# Patient Record
Sex: Female | Born: 1995 | Race: White | Hispanic: No | Marital: Single | Smoking: Current some day smoker
Health system: Southern US, Community
[De-identification: ages and names within clinical notes are randomized; demographics above are authoritative.]

## PROBLEM LIST (undated history)

## (undated) DIAGNOSIS — F329 Major depressive disorder, single episode, unspecified: Secondary | ICD-10-CM

## (undated) DIAGNOSIS — F32A Depression, unspecified: Secondary | ICD-10-CM

## (undated) DIAGNOSIS — F1110 Opioid abuse, uncomplicated: Secondary | ICD-10-CM

## (undated) DIAGNOSIS — F1910 Other psychoactive substance abuse, uncomplicated: Secondary | ICD-10-CM

## (undated) DIAGNOSIS — F419 Anxiety disorder, unspecified: Secondary | ICD-10-CM

---

## 2017-12-29 ENCOUNTER — Emergency Department (HOSPITAL_COMMUNITY): Payer: No Typology Code available for payment source

## 2017-12-29 ENCOUNTER — Emergency Department (HOSPITAL_COMMUNITY): Payer: No Typology Code available for payment source | Admitting: Anesthesiology

## 2017-12-29 ENCOUNTER — Encounter (HOSPITAL_COMMUNITY): Admission: EM | Disposition: A | Discharge status: Home / Self Care | Payer: Self-pay | Source: Home / Self Care

## 2017-12-29 ENCOUNTER — Encounter (HOSPITAL_COMMUNITY): Payer: Self-pay | Admitting: *Deleted

## 2017-12-29 ENCOUNTER — Inpatient Hospital Stay (HOSPITAL_COMMUNITY)
Admission: EM | Admit: 2017-12-29 | Discharge: 2018-01-02 | DRG: 957 | Disposition: A | Discharge status: Home / Self Care | Payer: No Typology Code available for payment source | Attending: General Surgery | Admitting: General Surgery

## 2017-12-29 ENCOUNTER — Inpatient Hospital Stay (HOSPITAL_COMMUNITY): Payer: No Typology Code available for payment source

## 2017-12-29 DIAGNOSIS — F17200 Nicotine dependence, unspecified, uncomplicated: Secondary | ICD-10-CM | POA: Diagnosis present

## 2017-12-29 DIAGNOSIS — S36039A Unspecified laceration of spleen, initial encounter: Secondary | ICD-10-CM | POA: Diagnosis present

## 2017-12-29 DIAGNOSIS — S270XXA Traumatic pneumothorax, initial encounter: Secondary | ICD-10-CM | POA: Diagnosis present

## 2017-12-29 DIAGNOSIS — S36032A Major laceration of spleen, initial encounter: Secondary | ICD-10-CM | POA: Diagnosis present

## 2017-12-29 DIAGNOSIS — F1110 Opioid abuse, uncomplicated: Secondary | ICD-10-CM | POA: Diagnosis present

## 2017-12-29 DIAGNOSIS — S22069A Unspecified fracture of T7-T8 vertebra, initial encounter for closed fracture: Secondary | ICD-10-CM | POA: Diagnosis present

## 2017-12-29 DIAGNOSIS — R740 Nonspecific elevation of levels of transaminase and lactic acid dehydrogenase [LDH]: Secondary | ICD-10-CM

## 2017-12-29 DIAGNOSIS — D62 Acute posthemorrhagic anemia: Secondary | ICD-10-CM

## 2017-12-29 DIAGNOSIS — S92352A Displaced fracture of fifth metatarsal bone, left foot, initial encounter for closed fracture: Secondary | ICD-10-CM | POA: Diagnosis present

## 2017-12-29 DIAGNOSIS — Z3201 Encounter for pregnancy test, result positive: Secondary | ICD-10-CM | POA: Diagnosis present

## 2017-12-29 DIAGNOSIS — R7401 Elevation of levels of liver transaminase levels: Secondary | ICD-10-CM

## 2017-12-29 DIAGNOSIS — J029 Acute pharyngitis, unspecified: Secondary | ICD-10-CM | POA: Diagnosis not present

## 2017-12-29 DIAGNOSIS — S73015A Posterior dislocation of left hip, initial encounter: Principal | ICD-10-CM

## 2017-12-29 DIAGNOSIS — F418 Other specified anxiety disorders: Secondary | ICD-10-CM | POA: Diagnosis present

## 2017-12-29 DIAGNOSIS — S41112A Laceration without foreign body of left upper arm, initial encounter: Secondary | ICD-10-CM | POA: Diagnosis present

## 2017-12-29 DIAGNOSIS — G8918 Other acute postprocedural pain: Secondary | ICD-10-CM

## 2017-12-29 DIAGNOSIS — S27322A Contusion of lung, bilateral, initial encounter: Secondary | ICD-10-CM | POA: Diagnosis present

## 2017-12-29 DIAGNOSIS — R52 Pain, unspecified: Secondary | ICD-10-CM | POA: Diagnosis present

## 2017-12-29 DIAGNOSIS — S0081XA Abrasion of other part of head, initial encounter: Secondary | ICD-10-CM | POA: Diagnosis present

## 2017-12-29 DIAGNOSIS — S42101A Fracture of unspecified part of scapula, right shoulder, initial encounter for closed fracture: Secondary | ICD-10-CM

## 2017-12-29 DIAGNOSIS — Z590 Homelessness: Secondary | ICD-10-CM | POA: Diagnosis not present

## 2017-12-29 DIAGNOSIS — S73005A Unspecified dislocation of left hip, initial encounter: Secondary | ICD-10-CM

## 2017-12-29 DIAGNOSIS — Z3490 Encounter for supervision of normal pregnancy, unspecified, unspecified trimester: Secondary | ICD-10-CM

## 2017-12-29 DIAGNOSIS — K661 Hemoperitoneum: Secondary | ICD-10-CM | POA: Diagnosis present

## 2017-12-29 DIAGNOSIS — J939 Pneumothorax, unspecified: Secondary | ICD-10-CM

## 2017-12-29 DIAGNOSIS — F1910 Other psychoactive substance abuse, uncomplicated: Secondary | ICD-10-CM

## 2017-12-29 DIAGNOSIS — S22000A Wedge compression fracture of unspecified thoracic vertebra, initial encounter for closed fracture: Secondary | ICD-10-CM

## 2017-12-29 DIAGNOSIS — S27329A Contusion of lung, unspecified, initial encounter: Secondary | ICD-10-CM | POA: Diagnosis present

## 2017-12-29 DIAGNOSIS — S22079A Unspecified fracture of T9-T10 vertebra, initial encounter for closed fracture: Secondary | ICD-10-CM | POA: Diagnosis present

## 2017-12-29 DIAGNOSIS — S5002XA Contusion of left elbow, initial encounter: Secondary | ICD-10-CM

## 2017-12-29 HISTORY — DX: Depression, unspecified: F32.A

## 2017-12-29 HISTORY — PX: HIP CLOSED REDUCTION: SHX983

## 2017-12-29 HISTORY — DX: Other psychoactive substance abuse, uncomplicated: F19.10

## 2017-12-29 HISTORY — DX: Major depressive disorder, single episode, unspecified: F32.9

## 2017-12-29 HISTORY — DX: Anxiety disorder, unspecified: F41.9

## 2017-12-29 HISTORY — DX: Opioid abuse, uncomplicated: F11.10

## 2017-12-29 LAB — COMPREHENSIVE METABOLIC PANEL
ALBUMIN: 3.1 g/dL — AB (ref 3.5–5.0)
ALT: 117 U/L — ABNORMAL HIGH (ref 14–54)
ANION GAP: 10 (ref 5–15)
AST: 313 U/L — ABNORMAL HIGH (ref 15–41)
Alkaline Phosphatase: 99 U/L (ref 38–126)
BUN: 5 mg/dL — ABNORMAL LOW (ref 6–20)
CHLORIDE: 103 mmol/L (ref 101–111)
CO2: 24 mmol/L (ref 22–32)
Calcium: 9.1 mg/dL (ref 8.9–10.3)
Creatinine, Ser: 0.7 mg/dL (ref 0.44–1.00)
GFR calc non Af Amer: >60 mL/min (ref >60)
GLUCOSE: 217 mg/dL — AB (ref 65–99)
POTASSIUM: 3 mmol/L — AB (ref 3.5–5.1)
SODIUM: 137 mmol/L (ref 135–145)
Total Bilirubin: 0.6 mg/dL (ref 0.3–1.2)
Total Protein: 7.4 g/dL (ref 6.5–8.1)

## 2017-12-29 LAB — URINALYSIS, ROUTINE W REFLEX MICROSCOPIC
Bacteria, UA: NONE SEEN
Bilirubin Urine: NEGATIVE
Glucose, UA: NEGATIVE mg/dL
KETONES UR: NEGATIVE mg/dL
LEUKOCYTES UA: NEGATIVE
Nitrite: NEGATIVE
Protein, ur: NEGATIVE mg/dL
SPECIFIC GRAVITY, URINE: 1.029 (ref 1.005–1.030)
SQUAMOUS EPITHELIAL / LPF: NONE SEEN
pH: 5 (ref 5.0–8.0)

## 2017-12-29 LAB — CBC
HEMATOCRIT: 38.3 % (ref 36.0–46.0)
HEMOGLOBIN: 12.5 g/dL (ref 12.0–15.0)
MCH: 26.8 pg (ref 26.0–34.0)
MCHC: 32.6 g/dL (ref 30.0–36.0)
MCV: 82 fL (ref 78.0–100.0)
Platelets: 300 10*3/uL (ref 150–400)
RBC: 4.67 MIL/uL (ref 3.87–5.11)
RDW: 13.6 % (ref 11.5–15.5)
WBC: 19.4 10*3/uL — ABNORMAL HIGH (ref 4.0–10.5)

## 2017-12-29 LAB — I-STAT CG4 LACTIC ACID, ED: LACTIC ACID, VENOUS: 2.38 mmol/L — AB (ref 0.5–1.9)

## 2017-12-29 LAB — RAPID URINE DRUG SCREEN, HOSP PERFORMED
AMPHETAMINES: POSITIVE — AB
BARBITURATES: NOT DETECTED
BENZODIAZEPINES: NOT DETECTED
COCAINE: NOT DETECTED
Opiates: POSITIVE — AB
Tetrahydrocannabinol: NOT DETECTED

## 2017-12-29 LAB — I-STAT CHEM 8, ED
BUN: 7 mg/dL (ref 6–20)
CALCIUM ION: 1.19 mmol/L (ref 1.15–1.40)
Chloride: 101 mmol/L (ref 101–111)
Creatinine, Ser: 0.5 mg/dL (ref 0.44–1.00)
Glucose, Bld: 210 mg/dL — ABNORMAL HIGH (ref 65–99)
HCT: 40 % (ref 36.0–46.0)
HEMOGLOBIN: 13.6 g/dL (ref 12.0–15.0)
Potassium: 3.1 mmol/L — ABNORMAL LOW (ref 3.5–5.1)
SODIUM: 140 mmol/L (ref 135–145)
TCO2: 28 mmol/L (ref 22–32)

## 2017-12-29 LAB — HCG, QUANTITATIVE, PREGNANCY: hCG, Beta Chain, Quant, S: <1 m[IU]/mL (ref <5)

## 2017-12-29 LAB — GLUCOSE, CAPILLARY: GLUCOSE-CAPILLARY: 115 mg/dL — AB (ref 65–99)

## 2017-12-29 LAB — I-STAT BETA HCG BLOOD, ED (MC, WL, AP ONLY): HCG, QUANTITATIVE: 12 m[IU]/mL — AB (ref <5)

## 2017-12-29 LAB — TRIGLYCERIDES: TRIGLYCERIDES: 40 mg/dL (ref <150)

## 2017-12-29 LAB — HEMOGLOBIN
HEMOGLOBIN: 9.1 g/dL — AB (ref 12.0–15.0)
Hemoglobin: 9.4 g/dL — ABNORMAL LOW (ref 12.0–15.0)

## 2017-12-29 LAB — PROTIME-INR
INR: 1.14
Prothrombin Time: 14.5 seconds (ref 11.4–15.2)

## 2017-12-29 LAB — MRSA PCR SCREENING: MRSA BY PCR: NEGATIVE

## 2017-12-29 LAB — SAMPLE TO BLOOD BANK

## 2017-12-29 LAB — LACTIC ACID, PLASMA: Lactic Acid, Venous: 1 mmol/L (ref 0.5–1.9)

## 2017-12-29 LAB — ETHANOL: Alcohol, Ethyl (B): <10 mg/dL (ref <10)

## 2017-12-29 SURGERY — CLOSED REDUCTION, HIP
Anesthesia: General | Site: Hip | Laterality: Left

## 2017-12-29 MED ORDER — METHOCARBAMOL 1000 MG/10ML IJ SOLN
500.0000 mg | Freq: Once | INTRAVENOUS | Status: AC
  Administered 2017-12-29: 500 mg via INTRAVENOUS
  Filled 2017-12-29: qty 5

## 2017-12-29 MED ORDER — LACTATED RINGERS IV SOLN
INTRAVENOUS | Status: DC | PRN
  Administered 2017-12-29: 06:00:00 via INTRAVENOUS

## 2017-12-29 MED ORDER — PROPOFOL 10 MG/ML IV BOLUS
INTRAVENOUS | Status: DC | PRN
  Administered 2017-12-29: 150 mg via INTRAVENOUS

## 2017-12-29 MED ORDER — PROPOFOL 500 MG/50ML IV EMUL
INTRAVENOUS | Status: DC | PRN
  Administered 2017-12-29: 30 ug/kg/min via INTRAVENOUS

## 2017-12-29 MED ORDER — SODIUM CHLORIDE 0.9 % IV BOLUS (SEPSIS): 1000.0000 mL | Freq: Once | INTRAVENOUS | Status: DC

## 2017-12-29 MED ORDER — CEFAZOLIN SODIUM-DEXTROSE 2-3 GM-%(50ML) IV SOLR
INTRAVENOUS | Status: DC | PRN
  Administered 2017-12-29: 2 g via INTRAVENOUS

## 2017-12-29 MED ORDER — ACETAMINOPHEN 325 MG PO TABS
650.0000 mg | ORAL_TABLET | ORAL | Status: DC | PRN
  Administered 2017-12-30 – 2018-01-02 (×8): 650 mg via ORAL
  Filled 2017-12-29 (×8): qty 2

## 2017-12-29 MED ORDER — CHLORHEXIDINE GLUCONATE 0.12% ORAL RINSE (MEDLINE KIT)
15.0000 mL | Freq: Two times a day (BID) | OROMUCOSAL | Status: DC
  Administered 2017-12-29 – 2017-12-30 (×4): 15 mL via OROMUCOSAL

## 2017-12-29 MED ORDER — FENTANYL 2500MCG IN NS 250ML (10MCG/ML) PREMIX INFUSION
0.0000 ug/h | INTRAVENOUS | Status: DC
  Administered 2017-12-29 – 2017-12-30 (×2): 200 ug/h via INTRAVENOUS
  Filled 2017-12-29 (×2): qty 250

## 2017-12-29 MED ORDER — PANTOPRAZOLE SODIUM 40 MG PO TBEC
40.0000 mg | DELAYED_RELEASE_TABLET | Freq: Every day | ORAL | Status: DC
  Administered 2017-12-31 – 2018-01-02 (×3): 40 mg via ORAL
  Filled 2017-12-29 (×3): qty 1

## 2017-12-29 MED ORDER — SUCCINYLCHOLINE CHLORIDE 200 MG/10ML IV SOSY
PREFILLED_SYRINGE | INTRAVENOUS | Status: AC
  Filled 2017-12-29: qty 10

## 2017-12-29 MED ORDER — FENTANYL BOLUS VIA INFUSION
25.0000 ug | INTRAVENOUS | Status: DC | PRN
  Filled 2017-12-29: qty 25

## 2017-12-29 MED ORDER — PANTOPRAZOLE SODIUM 40 MG IV SOLR
40.0000 mg | Freq: Every day | INTRAVENOUS | Status: DC
  Administered 2017-12-29 – 2017-12-30 (×2): 40 mg via INTRAVENOUS
  Filled 2017-12-29 (×5): qty 40

## 2017-12-29 MED ORDER — PROPOFOL 10 MG/ML IV BOLUS
INTRAVENOUS | Status: AC
  Filled 2017-12-29: qty 20

## 2017-12-29 MED ORDER — SUCCINYLCHOLINE 20MG/ML (10ML) SYRINGE FOR MEDFUSION PUMP - OPTIME
INTRAMUSCULAR | Status: DC | PRN
  Administered 2017-12-29: 120 mg via INTRAVENOUS

## 2017-12-29 MED ORDER — MIDAZOLAM HCL 2 MG/2ML IJ SOLN
INTRAMUSCULAR | Status: AC
  Filled 2017-12-29: qty 2

## 2017-12-29 MED ORDER — FENTANYL CITRATE (PF) 100 MCG/2ML IJ SOLN
50.0000 ug | Freq: Once | INTRAMUSCULAR | Status: DC
  Filled 2017-12-29: qty 2

## 2017-12-29 MED ORDER — FENTANYL CITRATE (PF) 100 MCG/2ML IJ SOLN
50.0000 ug | INTRAMUSCULAR | Status: DC | PRN
  Administered 2017-12-29 (×3): 50 ug via INTRAVENOUS
  Filled 2017-12-29 (×3): qty 2

## 2017-12-29 MED ORDER — PROPOFOL 1000 MG/100ML IV EMUL
0.0000 ug/kg/min | INTRAVENOUS | Status: DC
  Administered 2017-12-29 – 2017-12-30 (×3): 40 ug/kg/min via INTRAVENOUS
  Administered 2017-12-30: 30 ug/kg/min via INTRAVENOUS
  Filled 2017-12-29 (×4): qty 100

## 2017-12-29 MED ORDER — FENTANYL CITRATE (PF) 250 MCG/5ML IJ SOLN
INTRAMUSCULAR | Status: DC | PRN
  Administered 2017-12-29: 100 ug via INTRAVENOUS

## 2017-12-29 MED ORDER — DOCUSATE SODIUM 100 MG PO CAPS
100.0000 mg | ORAL_CAPSULE | Freq: Two times a day (BID) | ORAL | Status: DC
  Administered 2017-12-30 – 2018-01-02 (×6): 100 mg via ORAL
  Filled 2017-12-29 (×10): qty 1

## 2017-12-29 MED ORDER — SODIUM CHLORIDE 0.9 % IV SOLN: INTRAVENOUS | Status: DC

## 2017-12-29 MED ORDER — METOPROLOL TARTRATE 5 MG/5ML IV SOLN: 5.0000 mg | Freq: Four times a day (QID) | INTRAVENOUS | Status: DC | PRN

## 2017-12-29 MED ORDER — HYDRALAZINE HCL 20 MG/ML IJ SOLN: 10.0000 mg | INTRAMUSCULAR | Status: DC | PRN

## 2017-12-29 MED ORDER — SODIUM CHLORIDE 0.9 % IV SOLN
25.0000 ug/h | INTRAVENOUS | Status: DC
  Administered 2017-12-29: 50 ug/h via INTRAVENOUS
  Filled 2017-12-29: qty 50

## 2017-12-29 MED ORDER — FENTANYL CITRATE (PF) 100 MCG/2ML IJ SOLN
50.0000 ug | Freq: Once | INTRAMUSCULAR | Status: AC
  Administered 2017-12-29: 50 ug via INTRAVENOUS

## 2017-12-29 MED ORDER — SODIUM CHLORIDE 0.9 % IV SOLN
INTRAVENOUS | Status: AC | PRN
  Administered 2017-12-29: 1000 mL via INTRAVENOUS

## 2017-12-29 MED ORDER — ONDANSETRON 4 MG PO TBDP: 4.0000 mg | ORAL_TABLET | Freq: Four times a day (QID) | ORAL | Status: DC | PRN

## 2017-12-29 MED ORDER — ORAL CARE MOUTH RINSE
15.0000 mL | Freq: Four times a day (QID) | OROMUCOSAL | Status: DC
  Administered 2017-12-29 – 2017-12-31 (×6): 15 mL via OROMUCOSAL

## 2017-12-29 MED ORDER — SODIUM CHLORIDE 0.9 % IV BOLUS (SEPSIS)
1000.0000 mL | Freq: Once | INTRAVENOUS | Status: AC
  Administered 2017-12-29: 1000 mL via INTRAVENOUS

## 2017-12-29 MED ORDER — IOPAMIDOL (ISOVUE-300) INJECTION 61%
INTRAVENOUS | Status: AC
  Administered 2017-12-29: 100 mL
  Filled 2017-12-29: qty 100

## 2017-12-29 MED ORDER — FENTANYL CITRATE (PF) 100 MCG/2ML IJ SOLN
INTRAMUSCULAR | Status: AC
  Filled 2017-12-29: qty 2

## 2017-12-29 MED ORDER — ONDANSETRON HCL 4 MG/2ML IJ SOLN
4.0000 mg | Freq: Once | INTRAMUSCULAR | Status: AC
  Administered 2017-12-29: 4 mg via INTRAVENOUS
  Filled 2017-12-29: qty 2

## 2017-12-29 MED ORDER — TETANUS-DIPHTH-ACELL PERTUSSIS 5-2.5-18.5 LF-MCG/0.5 IM SUSP
0.5000 mL | Freq: Once | INTRAMUSCULAR | Status: AC
  Administered 2017-12-29: 0.5 mL via INTRAMUSCULAR
  Filled 2017-12-29: qty 0.5

## 2017-12-29 MED ORDER — LIDOCAINE HCL (CARDIAC) 20 MG/ML IV SOLN
INTRAVENOUS | Status: DC | PRN
  Administered 2017-12-29: 60 mg via INTRATRACHEAL

## 2017-12-29 MED ORDER — METHOCARBAMOL 1000 MG/10ML IJ SOLN
500.0000 mg | Freq: Once | INTRAMUSCULAR | Status: DC
  Filled 2017-12-29: qty 5

## 2017-12-29 MED ORDER — ONDANSETRON HCL 4 MG/2ML IJ SOLN: 4.0000 mg | Freq: Four times a day (QID) | INTRAMUSCULAR | Status: DC | PRN

## 2017-12-29 MED ORDER — LIDOCAINE 2% (20 MG/ML) 5 ML SYRINGE
INTRAMUSCULAR | Status: AC
  Filled 2017-12-29: qty 5

## 2017-12-29 MED ORDER — LIDOCAINE-EPINEPHRINE (PF) 2 %-1:200000 IJ SOLN
10.0000 mL | Freq: Once | INTRAMUSCULAR | Status: DC
  Filled 2017-12-29: qty 20

## 2017-12-29 MED ORDER — KCL IN DEXTROSE-NACL 20-5-0.45 MEQ/L-%-% IV SOLN
INTRAVENOUS | Status: DC
  Administered 2017-12-29: 75 mL/h via INTRAVENOUS
  Administered 2017-12-29 – 2018-01-01 (×5): via INTRAVENOUS
  Filled 2017-12-29 (×6): qty 1000

## 2017-12-29 MED ORDER — FENTANYL CITRATE (PF) 250 MCG/5ML IJ SOLN
INTRAMUSCULAR | Status: AC
  Filled 2017-12-29: qty 5

## 2017-12-29 SURGICAL SUPPLY — 4 items
BNDG GAUZE ELAST 4 BULKY (GAUZE/BANDAGES/DRESSINGS) ×2 IMPLANT
DRSG MEPITEL 3X4 ME34 (GAUZE/BANDAGES/DRESSINGS) ×2 IMPLANT
GAUZE SPONGE 4X4 12PLY STRL (GAUZE/BANDAGES/DRESSINGS) ×2 IMPLANT
TRAY FOLEY CATH SILVER 14FR (SET/KITS/TRAYS/PACK) ×2 IMPLANT

## 2017-12-29 NOTE — Progress Notes (Signed)
Orthopedic Tech Progress Note Patient Details:  [place] CellarDanielle Kretz 01/20/1996 [id number]  Ortho Devices Type of Ortho Device: Ace wrap, Shoulder immobilizer, Knee Immobilizer, Short arm splint Ortho Device/Splint Location: hip abd pillow Ortho Device/Splint Interventions: Application   Post Interventions Patient Tolerated: Well Instructions Provided: Care of device   Saul FordyceJennifer C Ariza Evans 12/29/2017, 11:12 AM

## 2017-12-29 NOTE — Brief Op Note (Signed)
12/29/2017  7:26 AM  PATIENT:  [name]  22 y.o. female  PRE-OPERATIVE DIAGNOSIS:   1. Left hip posterior dislocation 2. Left arm traumatic laceration 5 cm x 5 cm 3. Left elbow swelling and recurvatum  POST-OPERATIVE DIAGNOSIS:   1. Left hip posterior dislocation 2. Left arm traumatic laceration 5 cm x 5 cm 3. Left elbow swelling and recurvatum  PROCEDURE:  Procedure(s): 1. CLOSED REDUCTION HIP UNDER GENERAL ANESTHESIA, LEFT 2. SURGICAL DEBRIDEMENT OF SKIN, SUBCU, AND FASCIA LEFT ARM WOUND 3. COMPLEX CLOSURE 5CM 4. STRESS FLOURO LEFT HIP 5. STRESS FLOURO LEFT ELBOW  SURGEON:  Surgeon(s) and Role:    * Myrene GalasHandy, Dahmir Epperly, MD - Primary  ASSISTANTS: none   ANESTHESIA:   general  EBL:  minimal  BLOOD ADMINISTERED:none  DRAINS: none   LOCAL MEDICATIONS USED:  NONE  SPECIMEN:  No Specimen  DISPOSITION OF SPECIMEN:  N/A  COUNTS:  YES  TOURNIQUET:  * No tourniquets in log *  DICTATION: .Other Dictation: Dictation Number [id number]  PLAN OF CARE: Admit to inpatient   PATIENT DISPOSITION: ICU, hemodynamically stable   Delay start of Pharmacological VTE agent (>24hrs) due to surgical blood loss or risk of bleeding: no

## 2017-12-29 NOTE — Progress Notes (Signed)
Orthopedic Tech Progress Note Patient Details:  [name] 05/07/1996 [id number]  Patient ID: Carol Cellaranielle [name], female   DOB: 10/27/1996, 22 y.o.   MRN: [id number]   Saul FordyceJennifer C Eddi Hymes 12/29/2017, 11:15 AMCalled [employer] for TLSO brace.

## 2017-12-29 NOTE — ED Notes (Signed)
Pt attempting to use a female urinal while in xray, unable to void at this time
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
## 2017-12-29 NOTE — ED Notes (Signed)
Pt taken to xray 
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
## 2017-12-29 NOTE — Anesthesia Procedure Notes (Signed)
Procedure Name: Intubation Date/Time: 12/29/2017 6:37 AM Performed by: Molli HazardGordon, Karol Skarzynski M, CRNA Pre-anesthesia Checklist: Patient identified, Emergency Drugs available, Suction available and Patient being monitored Patient Re-evaluated:Patient Re-evaluated prior to induction Oxygen Delivery Method: Circle system utilized Preoxygenation: Pre-oxygenation with 100% oxygen Induction Type: IV induction, Rapid sequence and Cricoid Pressure applied Laryngoscope Size: Glidescope Grade View: Grade I Tube type: Subglottic suction tube Tube size: 7.0 mm Number of attempts: 1 Airway Equipment and Method: Stylet Placement Confirmation: ETT inserted through vocal cords under direct vision,  positive ETCO2 and breath sounds checked- equal and bilateral Secured at: 21 cm Tube secured with: Tape Dental Injury: Teeth and Oropharynx as per pre-operative assessment

## 2017-12-29 NOTE — Progress Notes (Signed)
Pt transported from MRI to 2M03 on ventilator. Pt stable throughout with no complications. VS within normal limits.
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
## 2017-12-29 NOTE — Progress Notes (Signed)
Patient being treated by medical team.  No family present at current time.  Will continue to follow up as necessary.     12/29/17 0154  Clinical Encounter Type  Visited With Patient;Health care provider  Visit Type Initial;Spiritual support;Code;Trauma

## 2017-12-29 NOTE — Consult Note (Signed)
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 contusions and partially visualized probable tiny right pneumothorax. Please refer to the CT of the chest abdomen pelvis report. Electronically Signed   By: Anner Crete M.D.   On: 12/29/2017 04:38    ROS No recent fever, bleeding abnormalities, urologic dysfunction, GI problems, or weight gain.  Blood pressure 102/69, pulse (!) 103, temperature (!) 96.8 F (36 C), temperature source Temporal, resp. rate (!) 24, height 5' (1.524 m), weight 59 kg (130 lb), SpO2 100 %. Physical Exam In distress Poor teeth with probable meth  LLE No traumatic wounds, ecchymosis, or rash  Tender and internal rotation adduction of hip  No knee or ankle effusion  Knee stable to varus/ valgus and anterior/posterior stress  Sens DPN, SPN, TN intact  Motor EHL, ext, flex, evers 5/5  DP 2+, PT 2+, No significant edema RLE No traumatic wounds, ecchymosis, or rash  Tender and internal rotation adduction of hip  No knee or ankle effusion  Knee stable to varus/ valgus and anterior/posterior stress  Sens DPN, SPN, TN intact  Motor EHL, ext, flex, evers 5/5  DP 2+, PT 2+, No significant edema LUEx    elbow, wrist, digits- no skin  wounds, nontender, no instability, no blocks to motion  Larger laceration of upper arm  Sens  Ax/R/M/U intact  Mot   Ax/ R/ PIN/ M/ AIN/ U intact  Rad 2+ RUEx shoulder, elbow, wrist, digits- no skin wounds, nontender, no instability, no blocks to motion  Sens  Ax/R/M/U intact  Mot   Ax/ R/ PIN/ M/ AIN/ U intact  Rad 2+ Pelvis--no traumatic wounds or rash, no ecchymosis, stable to manual stress, nontender  Assessment/Plan: Left hip dislocation Pregnant IVDA Left  Arm laceration  Emergent reduction of left hip planned now with I&D and closure of laceration  Although we will proceed immediately, I did discuss with the patient the risks and benefits of surgery, including the possibility of infection, nerve injury, vessel injury, wound breakdown, arthritis, symptomatic hardware, DVT/ PE, loss of motion, malunion, nonunion, and need for further surgery among others.  We also specifically discussed possible effects of anesthesia or injury on her pregnancy. She acknowledged these risks and wished to proceed.  [place], MD [facility] [phone] (p)  12/29/2017  6:17 AM

## 2017-12-29 NOTE — Progress Notes (Signed)
PT Cancellation Note  Patient Details Name: [name] MRN: [id number] DOB: 09/11/1996   Cancelled Treatment:    Reason Eval/Treat Not Completed: Patient not medically ready. PT eval received, chart reviewed. Pt currently in surgery, however noted active bedrest orders due to splenic laceration as well. Will continue to follow for medical readiness to participate with PT evaluation.   Marylynn PearsonLaura D Mehul Rudin 12/29/2017, 7:18 AM   Conni SlipperLaura Taziah Difatta, PT, DPT Acute Rehabilitation Services Pager: [phone]
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
## 2017-12-29 NOTE — ED Notes (Signed)
Pt taken to CT.

## 2017-12-29 NOTE — Transfer of Care (Signed)
Immediate Anesthesia Transfer of Care Note  Patient: Nevis CellarDanielle Leu  Procedure(s) Performed: CLOSED REDUCTION HIP left, left arm laceration closure, left arm debridement, fluoro of left elbow and left hip (Left Hip)  Patient Location: ICU  Anesthesia Type:General  Level of Consciousness: sedated and Patient remains intubated per anesthesia plan  Airway & Oxygen Therapy: Patient remains intubated per anesthesia plan and Patient placed on Ventilator (see vital sign flow sheet for setting)  Post-op Assessment: Report given to RN and Post -op Vital signs reviewed and stable  Post vital signs: Reviewed and stable  Last Vitals:  Vitals:   12/29/17 0500 12/29/17 0530  BP: 125/88 102/69  Pulse: (!) 121 (!) 103  Resp: (!) 24 (!) 24  Temp:    SpO2: 100% 100%    Last Pain:  Vitals:   12/29/17 0144  TempSrc:   PainSc: 10-Worst pain ever         Complications: No apparent anesthesia complications

## 2017-12-29 NOTE — ED Notes (Signed)
Mother and another family member at bedside. Dr. Elesa MassedWard updated family on injuries and plan of care

## 2017-12-29 NOTE — Progress Notes (Signed)
Pt transported from 2M03 to MRI on vent. Pt stable throughout with no complications. VS within normal limits.

## 2017-12-30 LAB — BASIC METABOLIC PANEL
ANION GAP: 10 (ref 5–15)
BUN: <5 mg/dL — ABNORMAL LOW (ref 6–20)
CHLORIDE: 106 mmol/L (ref 101–111)
CO2: 23 mmol/L (ref 22–32)
CREATININE: 0.65 mg/dL (ref 0.44–1.00)
Calcium: 8 mg/dL — ABNORMAL LOW (ref 8.9–10.3)
GFR calc non Af Amer: >60 mL/min (ref >60)
Glucose, Bld: 111 mg/dL — ABNORMAL HIGH (ref 65–99)
Potassium: 3.7 mmol/L (ref 3.5–5.1)
Sodium: 139 mmol/L (ref 135–145)

## 2017-12-30 LAB — HEMOGLOBIN
HEMOGLOBIN: 9.3 g/dL — AB (ref 12.0–15.0)
HEMOGLOBIN: 9.1 g/dL — AB (ref 12.0–15.0)
Hemoglobin: 8.5 g/dL — ABNORMAL LOW (ref 12.0–15.0)

## 2017-12-30 LAB — CBC
HEMATOCRIT: 29.4 % — AB (ref 36.0–46.0)
HEMOGLOBIN: 9.2 g/dL — AB (ref 12.0–15.0)
MCH: 26.2 pg (ref 26.0–34.0)
MCHC: 31.3 g/dL (ref 30.0–36.0)
MCV: 83.8 fL (ref 78.0–100.0)
Platelets: 184 10*3/uL (ref 150–400)
RBC: 3.51 MIL/uL — ABNORMAL LOW (ref 3.87–5.11)
RDW: 14.1 % (ref 11.5–15.5)
WBC: 5.6 10*3/uL (ref 4.0–10.5)

## 2017-12-30 LAB — GLUCOSE, CAPILLARY: GLUCOSE-CAPILLARY: 93 mg/dL (ref 65–99)

## 2017-12-30 LAB — CDS SEROLOGY

## 2017-12-30 MED ORDER — SODIUM CHLORIDE 0.9 % IV SOLN
500.0000 mL | Freq: Once | INTRAVENOUS | Status: AC
  Administered 2017-12-30: 500 mL via INTRAVENOUS

## 2017-12-30 MED ORDER — HYDROMORPHONE HCL 1 MG/ML IJ SOLN
1.0000 mg | INTRAMUSCULAR | Status: DC | PRN
  Administered 2017-12-30: 2 mg via INTRAVENOUS
  Administered 2017-12-30 (×2): 1 mg via INTRAVENOUS
  Administered 2017-12-31 (×2): 2 mg via INTRAVENOUS
  Administered 2017-12-31 – 2018-01-01 (×5): 1 mg via INTRAVENOUS
  Filled 2017-12-30 (×2): qty 1
  Filled 2017-12-30: qty 2
  Filled 2017-12-30: qty 1
  Filled 2017-12-30 (×2): qty 2
  Filled 2017-12-30 (×4): qty 1
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
## 2017-12-30 NOTE — Progress Notes (Signed)
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 FINDINGS Brain: No evidence of acute infarction, hemorrhage, hydrocephalus, extra-axial collection or mass lesion/mass effect. Vascular: No hyperdense vessel or unexpected calcification. Skull: Normal. Negative for fracture or focal lesion. Other: Skin laceration and small hematoma in the soft tissues of the suboccipital region. Multiple small high attenuating foci in the right frontal scalp, likely chronic foci of calcification. Clinical correlation is recommended to exclude foreign object there is skin laceration over the left parietal convexity. CT MAXILLOFACIAL FINDINGS Osseous: There multiple fractures of the nasal bone with depressed appearance of the nasal bridge. No other acute fracture. Orbits: The globes and retro-orbital fat are preserved. Sinuses: Mild diffuse mucoperiosteal thickening of paranasal sinuses. Near complete opacification of the right maxillary sinus. The mastoid air cells are clear. Soft tissues: Soft tissue swelling over the nose. CT CERVICAL SPINE FINDINGS Alignment: No acute subluxation. There is mild reversal of normal cervical lordosis at C6-C7 which may be positional or due to muscle spasm. Skull  base and vertebrae: No acute fracture. No primary bone lesion or focal pathologic process. Soft tissues and spinal canal: No prevertebral fluid or swelling. No visible canal hematoma. Disc levels:  No acute findings.  No degenerative changes. Upper chest: Bilateral upper lobe airspace opacities most likely representing pulmonary contusions. Small focus of air along the medial pleural surface of the right upper lobe, likely a tiny pneumothorax. Please refer to the report for the chest CT. Other: Partially visualized comminuted appearing right scapular fracture. IMPRESSION: 1. No acute intracranial hemorrhage. 2. No acute/traumatic cervical spine pathology. 3. Nasal bone fractures. 4. Partially visualized comminuted fracture of the right scapula. 5. Bilateral upper lobe pulmonary contusions and partially visualized probable tiny right pneumothorax. Please refer to the CT of the chest abdomen pelvis report. Electronically Signed   By: [name] M.D.   On: 12/29/2017 04:38    Assessment/Plan: Thoracic fractures, ligamentous injury: I think the patient will likely heal well in the TLSO.  I have answered the patient's mother's questions.  LOS: 1 day     Cristi Loron 12/30/2017, 12:10 PM

## 2017-12-30 NOTE — Progress Notes (Signed)
Follow up - Trauma and Critical Care  Patient Details:    [name] is an 22 y.o. female.  Lines/tubes : Airway 7 mm (Active)  Secured at (cm) 22 cm 12/30/2017  8:06 AM  Measured From Lips 12/30/2017  8:06 AM  Secured Location Center 12/30/2017  8:06 AM  Secured By [employer] Tube Holder 12/30/2017  8:06 AM  Tube Holder Repositioned Yes 12/30/2017  8:06 AM  Cuff Pressure (cm H2O) 22 cm H2O 12/30/2017  8:06 AM  Site Condition Cool;Dry 12/30/2017  8:06 AM     Urethral Catheter [name] Latex;Straight-tip 14 Fr. (Active)  Indication for Insertion or Continuance of Catheter Unstable critical patients (first 24-48 hours) 12/30/2017  8:00 AM  Site Assessment Clean;Intact 12/30/2017 12:00 AM  Catheter Maintenance Bag below level of bladder;Catheter secured;Drainage bag/tubing not touching floor;Insertion date on drainage bag;No dependent loops;Seal intact 12/30/2017  7:44 AM  Collection Container Leg bag 12/29/2017  8:00 PM  Securement Method Leg strap 12/29/2017  4:00 PM  Urinary Catheter Interventions Unclamped 12/29/2017  4:00 PM  Output (mL) 50 mL 12/30/2017  8:00 AM    Microbiology/Sepsis markers: Results for orders placed or performed during the hospital encounter of 12/29/17  MRSA PCR Screening     Status: None   Collection Time: 12/29/17  8:18 AM  Result Value Ref Range Status   MRSA by PCR NEGATIVE NEGATIVE Final    Comment:        The GeneXpert MRSA Assay (FDA approved for NASAL specimens only), is one component of a comprehensive MRSA colonization surveillance program. It is not intended to diagnose MRSA infection nor to guide or monitor treatment for MRSA infections.     Anti-infectives:  Anti-infectives (From admission, onward)   None      Best Practice/Protocols:  VTE Prophylaxis: Mechanical GI Prophylaxis: Proton Pump Inhibitor Continous Sedation  Consults: Treatment Team:  Myrene GalasHandy, Michael, MD Haddix, Gillie MannersKevin P, MD Ditty, Loura HaltBenjamin Jared, MD     Events:  Subjective:    Overnight Issues: Sedation just cut off this AM.  She would not awaken for me at all.  Objective:  Vital signs for last 24 hours: Temp:  [97.8 F (36.6 C)-101.2 F (38.4 C)] 98.9 F (37.2 C) (01/19 0745) Pulse Rate:  [95-128] 119 (01/19 0600) Resp:  [13-30] 15 (01/19 0600) BP: (93-120)/(53-75) 106/65 (01/19 0600) SpO2:  [98 %-100 %] 100 % (01/19 0806) FiO2 (%):  [30 %-40 %] 30 % (01/19 0806)  Hemodynamic parameters for last 24 hours:    Intake/Output from previous day: 01/18 0701 - 01/19 0700 In: 2159.4 [I.V.:2159.4] Out: 1350 [Urine:1350]  Intake/Output this shift: Total I/O In: 109.2 [I.V.:109.2] Out: 50 [Urine:50]  Vent settings for last 24 hours: Vent Mode: PRVC FiO2 (%):  [30 %-40 %] 30 % Set Rate:  [15 bmp] 15 bmp Vt Set:  [370 mL] 370 mL PEEP:  [5 cmH20] 5 cmH20 Pressure Support:  [5 cmH20] 5 cmH20 Plateau Pressure:  [11 cmH20-15 cmH20] 12 cmH20  Physical Exam:  General: no respiratory distress and sedated Neuro: RASS -2 Resp: clear to auscultation bilaterally CVS: regular rate and rhythm, S1, S2 normal, no murmur, click, rub or gallop GI: Soft with active bowel sounds. Extremities: Wedge between legs for hip dislocation.  Results for orders placed or performed during the hospital encounter of 12/29/17 (from the past 24 hour(s))  Triglycerides     Status: None   Collection Time: 12/29/17  9:19 AM  Result Value Ref Range   Triglycerides 40 <150  mg/dL  Hemoglobin     Status: Abnormal   Collection Time: 12/29/17  9:19 AM  Result Value Ref Range   Hemoglobin 9.4 (L) 12.0 - 15.0 g/dL  Lactic acid, plasma     Status: None   Collection Time: 12/29/17 10:37 AM  Result Value Ref Range   Lactic Acid, Venous 1.0 0.5 - 1.9 mmol/L  Hemoglobin     Status: Abnormal   Collection Time: 12/29/17  6:38 PM  Result Value Ref Range   Hemoglobin 9.1 (L) 12.0 - 15.0 g/dL  Hemoglobin     Status: Abnormal   Collection Time: 12/30/17  3:48  AM  Result Value Ref Range   Hemoglobin 9.3 (L) 12.0 - 15.0 g/dL  CBC     Status: Abnormal   Collection Time: 12/30/17  3:48 AM  Result Value Ref Range   WBC 5.6 4.0 - 10.5 K/uL   RBC 3.51 (L) 3.87 - 5.11 MIL/uL   Hemoglobin 9.2 (L) 12.0 - 15.0 g/dL   HCT 16.1 (L) 09.6 - 04.5 %   MCV 83.8 78.0 - 100.0 fL   MCH 26.2 26.0 - 34.0 pg   MCHC 31.3 30.0 - 36.0 g/dL   RDW 40.9 81.1 - 91.4 %   Platelets 184 150 - 400 K/uL  Basic metabolic panel     Status: Abnormal   Collection Time: 12/30/17  3:48 AM  Result Value Ref Range   Sodium 139 135 - 145 mmol/L   Potassium 3.7 3.5 - 5.1 mmol/L   Chloride 106 101 - 111 mmol/L   CO2 23 22 - 32 mmol/L   Glucose, Bld 111 (H) 65 - 99 mg/dL   BUN <5 (L) 6 - 20 mg/dL   Creatinine, Ser 7.82 0.44 - 1.00 mg/dL   Calcium 8.0 (L) 8.9 - 10.3 mg/dL   GFR calc non Af Amer >60 >60 mL/min   GFR calc Af Amer >60 >60 mL/min   Anion gap 10 5 - 15     Assessment/Plan:   NEURO  Altered Mental Status:  agitation, pain and sedation   Plan: Gets very agitated with medications withdrawal.  PULM  No known issues   Plan: Wean to extubation  CARDIO  No specific issues   Plan: CPM  RENAL  Slight drop off on urine output.   Plan: Bolus with saline x 1 of 500cc  GI  Splenic Trauma with Grade III laceration.    Plan: Will start Lovenox tomorrow after 48 with stable Hgb  ID  No know infectious problems   Plan: CPM  HEME  Anemia acute blood loss anemia)   Plan: No need for blood transfusion  ENDO No known issues   Plan: CPM  Global Issues  Patient doing okay, will work very hard to get the patient extubated.  Pain control will be an issue afterwards..  Bolus with saline    LOS: 1 day   Additional comments:I reviewed the patient's new clinical lab test results. cbc/bmet  Critical Care Total Time*: 30 Minutes  Jimmye Norman 12/30/2017  *Care during the described time interval was provided by me and/or other providers on the critical care team.  I have  reviewed this patient's available data, including medical history, events of note, physical examination and test results as part of my evaluation.

## 2017-12-30 NOTE — Progress Notes (Signed)
6440ml's of Fentanyl wasted via sink witnessed by Clarita CraneKeely, Charity fundraiserN.

## 2017-12-30 NOTE — Progress Notes (Signed)
[facility]opaedic Trauma [facility] (OTS)  1 Day Post-Op Procedure(s) (LRB): CLOSED REDUCTION HIP left, left arm laceration closure, left arm debridement, fluoro of left elbow and left hip (Left)  Subjective: Patient reports pain as severe.  Denies numbness and tingling. Able to repeat back hip precautions.  Objective: Current Vitals Blood pressure 128/80, pulse (!) 112, temperature 98.9 F (37.2 C), temperature source Oral, resp. rate 10, height 5' (1.524 m), weight 59 kg (130 lb), SpO2 100 %. Vital signs in last 24 hours: Temp:  [97.8 F (36.6 C)-101.2 F (38.4 C)] 98.9 F (37.2 C) (01/19 0745) Pulse Rate:  [98-128] 112 (01/19 0930) Resp:  [10-30] 10 (01/19 0930) BP: (93-128)/(53-80) 128/80 (01/19 0930) SpO2:  [98 %-100 %] 100 % (01/19 0930) FiO2 (%):  [30 %-40 %] 30 % (01/19 0915)  Intake/Output from previous day: 01/18 0701 - 01/19 0700 In: 2159.4 [I.V.:2159.4] Out: 1350 [Urine:1350]  LABS Recent Labs    12/29/17 0150 12/29/17 0206 12/29/17 0919 12/29/17 1838 12/30/17 0348  HGB 12.5 13.6 9.4* 9.1* 9.3*  9.2*   Recent Labs    12/29/17 0150 12/29/17 0206 12/30/17 0348  WBC 19.4*  --  5.6  RBC 4.67  --  3.51*  HCT 38.3 40.0 29.4*  PLT 300  --  184   Recent Labs    12/29/17 0150 12/29/17 0206 12/30/17 0348  NA 137 140 139  K 3.0* 3.1* 3.7  CL 103 101 106  CO2 24  --  23  BUN 5* 7 <5*  CREATININE 0.70 0.50 0.65  GLUCOSE 217* 210* 111*  CALCIUM 9.1  --  8.0*   Recent Labs    12/29/17 0150  INR 1.14     Physical Exam LUE  Wound looks excellent; dressing changed  Sens  Ax/R/M/U intact  Mot   Ax/ R/ PIN/ M/ AIN/ U intact  Brisk CR LLE Edema/ swelling controlled  Sens: DPN, SPN, TN intact  Motor: EHL, FHL, and lessor toe ext and flex all intact  DP 2+, Brisk cap refill, warm to touch  Assessment/Plan: 1 Day Post-Op Procedure(s) (LRB): CLOSED REDUCTION HIP left, left arm laceration closure, left arm debridement, fluoro of left elbow and left hip  (Left) 1. PT/OT TDWB with posterior hip precautions on the left; platform or regular walker according to best tolerance L wrist 2. DVT proph per Trauma Service 3. Dressing changes daily left arm  Myrene GalasMichael Indalecio Malmstrom, MD [facility] [phone] (p)

## 2017-12-30 NOTE — Procedures (Signed)
Extubation Procedure Note  Patient Details:   Name: Mescal CellarDanielle Erler DOB: 02/07/1996 MRN: [id number]   Airway Documentation:     Evaluation  O2 sats: stable throughout Complications: No apparent complications Patient did tolerate procedure well. Bilateral Breath Sounds: Clear, Diminished   Patient extubated to 3 L [place] without incident. Cuff leak noted prior to extubation. No distress noted. Patient able to speak post extubation will continue to monitor.   Italyhad M Devlin Mcveigh 12/30/2017, 9:34 AM

## 2017-12-30 NOTE — Progress Notes (Signed)
PT Cancellation Note  Patient Details Name: Landa CellarDanielle Chenevert MRN: [id number] DOB: 01/20/1996   Cancelled Treatment:    Reason Eval/Treat Not Completed: Medical issues which prohibited therapy. Pt currently with bed rest orders in place. PT will continue to f/u with pt and await updated activity orders prior to initiation of PT evaluation.    Alessandra BevelsJennifer M Towana Stenglein 12/30/2017, 8:48 AM

## 2017-12-31 ENCOUNTER — Other Ambulatory Visit: Payer: Self-pay

## 2017-12-31 ENCOUNTER — Encounter (HOSPITAL_COMMUNITY): Payer: Self-pay | Admitting: *Deleted

## 2017-12-31 LAB — BASIC METABOLIC PANEL
Anion gap: 6 (ref 5–15)
CALCIUM: 8.3 mg/dL — AB (ref 8.9–10.3)
CO2: 29 mmol/L (ref 22–32)
Chloride: 106 mmol/L (ref 101–111)
Creatinine, Ser: 0.41 mg/dL — ABNORMAL LOW (ref 0.44–1.00)
GFR calc Af Amer: >60 mL/min (ref >60)
GLUCOSE: 136 mg/dL — AB (ref 65–99)
POTASSIUM: 4 mmol/L (ref 3.5–5.1)
Sodium: 141 mmol/L (ref 135–145)

## 2017-12-31 LAB — CBC WITH DIFFERENTIAL/PLATELET
Basophils Absolute: 0 10*3/uL (ref 0.0–0.1)
Basophils Relative: 1 %
EOS PCT: 3 %
Eosinophils Absolute: 0.1 10*3/uL (ref 0.0–0.7)
HCT: 28 % — ABNORMAL LOW (ref 36.0–46.0)
Hemoglobin: 9 g/dL — ABNORMAL LOW (ref 12.0–15.0)
LYMPHS ABS: 1.4 10*3/uL (ref 0.7–4.0)
LYMPHS PCT: 39 %
MCH: 26.9 pg (ref 26.0–34.0)
MCHC: 32.1 g/dL (ref 30.0–36.0)
MCV: 83.8 fL (ref 78.0–100.0)
MONO ABS: 0.3 10*3/uL (ref 0.1–1.0)
Monocytes Relative: 8 %
Neutro Abs: 1.8 10*3/uL (ref 1.7–7.7)
Neutrophils Relative %: 49 %
PLATELETS: 157 10*3/uL (ref 150–400)
RBC: 3.34 MIL/uL — ABNORMAL LOW (ref 3.87–5.11)
RDW: 13.9 % (ref 11.5–15.5)
WBC: 3.6 10*3/uL — ABNORMAL LOW (ref 4.0–10.5)

## 2017-12-31 MED ORDER — ORAL CARE MOUTH RINSE
15.0000 mL | Freq: Two times a day (BID) | OROMUCOSAL | Status: DC
  Administered 2017-12-31 – 2018-01-01 (×3): 15 mL via OROMUCOSAL

## 2017-12-31 MED ORDER — ENOXAPARIN SODIUM 40 MG/0.4ML ~~LOC~~ SOLN
40.0000 mg | Freq: Every day | SUBCUTANEOUS | Status: DC
  Administered 2017-12-31 – 2018-01-02 (×3): 40 mg via SUBCUTANEOUS
  Filled 2017-12-31 (×4): qty 0.4
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
## 2017-12-31 NOTE — Progress Notes (Signed)
Follow up - Trauma and Critical Care  Patient Details:    [name] is an 22 y.o. female.  Lines/tubes : Airway 7 mm (Active)  Secured at (cm) 22 cm 12/30/2017  8:06 AM  Measured From Lips 12/30/2017  8:06 AM  Secured Location Center 12/30/2017  8:06 AM  Secured By [employer] 12/30/2017  8:06 AM  Tube Holder Repositioned Yes 12/30/2017  8:06 AM  Cuff Pressure (cm H2O) 22 cm H2O 12/30/2017  8:06 AM  Site Condition Cool;Dry 12/30/2017  8:06 AM     Urethral Catheter [name] Latex;Straight-tip 14 Fr. (Active)  Indication for Insertion or Continuance of Catheter Unstable critical patients (first 24-48 hours) 12/30/2017  8:00 AM  Site Assessment Clean;Intact 12/30/2017 12:00 AM  Catheter Maintenance Bag below level of bladder;Catheter secured;Drainage bag/tubing not touching floor;Insertion date on drainage bag;No dependent loops;Seal intact 12/30/2017  7:44 AM  Collection Container Leg bag 12/29/2017  8:00 PM  Securement Method Leg strap 12/29/2017  4:00 PM  Urinary Catheter Interventions Unclamped 12/29/2017  4:00 PM  Output (mL) 50 mL 12/30/2017  8:00 AM    Microbiology/Sepsis markers: Results for orders placed or performed during the hospital encounter of 12/29/17  MRSA PCR Screening     Status: None   Collection Time: 12/29/17  8:18 AM  Result Value Ref Range Status   MRSA by PCR NEGATIVE NEGATIVE Final    Comment:        The GeneXpert MRSA Assay (FDA approved for NASAL specimens only), is one component of a comprehensive MRSA colonization surveillance program. It is not intended to diagnose MRSA infection nor to guide or monitor treatment for MRSA infections.     Anti-infectives:  Anti-infectives (From admission, onward)   None      Best Practice/Protocols:  VTE Prophylaxis: Mechanical GI Prophylaxis: Proton Pump Inhibitor Continous Sedation  Consults: Treatment Team:  Myrene Galas, MD Haddix, Gillie Manners, MD Ditty, Loura Halt, MD     Events:  Subjective:    Overnight Issues: Sedation just cut off this AM.  She would not awaken for me at all.  Objective:  Vital signs for last 24 hours: Temp:  [98.1 F (36.7 C)-99.4 F (37.4 C)] 98.1 F (36.7 C) (01/20 0417) Pulse Rate:  [88-129] 88 (01/20 0900) Resp:  [12-32] 19 (01/20 0900) BP: (106-131)/(60-96) 114/78 (01/20 0900) SpO2:  [97 %-100 %] 100 % (01/20 0900)  Hemodynamic parameters for last 24 hours:    Intake/Output from previous day: 01/19 0701 - 01/20 0700 In: 2108.7 [P.O.:240; I.V.:1868.7] Out: 3800 [Urine:3800]  Intake/Output this shift: Total I/O In: 150 [I.V.:150] Out: 800 [Urine:800]  Vent settings for last 24 hours:    Physical Exam:  General: no respiratory distress and sedated Neuro: RASS -2 Resp: clear to auscultation bilaterally CVS: regular rate and rhythm, S1, S2 normal, no murmur, click, rub or gallop GI: Soft with active bowel sounds. Extremities: Wedge between legs for hip dislocation.  Results for orders placed or performed during the hospital encounter of 12/29/17 (from the past 24 hour(s))  Glucose, capillary     Status: None   Collection Time: 12/30/17 12:04 PM  Result Value Ref Range   Glucose-Capillary 93 65 - 99 mg/dL   Comment 1 Capillary Specimen    Comment 2 Notify RN   Hemoglobin     Status: Abnormal   Collection Time: 12/30/17  4:56 PM  Result Value Ref Range   Hemoglobin 8.5 (L) 12.0 - 15.0 g/dL  Hemoglobin  Status: Abnormal   Collection Time: 12/30/17 10:59 PM  Result Value Ref Range   Hemoglobin 9.1 (L) 12.0 - 15.0 g/dL  CBC with Differential/Platelet     Status: Abnormal   Collection Time: 12/31/17  4:30 AM  Result Value Ref Range   WBC 3.6 (L) 4.0 - 10.5 K/uL   RBC 3.34 (L) 3.87 - 5.11 MIL/uL   Hemoglobin 9.0 (L) 12.0 - 15.0 g/dL   HCT 16.128.0 (L) 09.636.0 - 04.546.0 %   MCV 83.8 78.0 - 100.0 fL   MCH 26.9 26.0 - 34.0 pg   MCHC 32.1 30.0 - 36.0 g/dL   RDW 40.913.9 81.111.5 - 91.415.5 %   Platelets 157 150 - 400  K/uL   Neutrophils Relative % 49 %   Neutro Abs 1.8 1.7 - 7.7 K/uL   Lymphocytes Relative 39 %   Lymphs Abs 1.4 0.7 - 4.0 K/uL   Monocytes Relative 8 %   Monocytes Absolute 0.3 0.1 - 1.0 K/uL   Eosinophils Relative 3 %   Eosinophils Absolute 0.1 0.0 - 0.7 K/uL   Basophils Relative 1 %   Basophils Absolute 0.0 0.0 - 0.1 K/uL  Basic metabolic panel     Status: Abnormal   Collection Time: 12/31/17  4:30 AM  Result Value Ref Range   Sodium 141 135 - 145 mmol/L   Potassium 4.0 3.5 - 5.1 mmol/L   Chloride 106 101 - 111 mmol/L   CO2 29 22 - 32 mmol/L   Glucose, Bld 136 (H) 65 - 99 mg/dL   BUN <5 (L) 6 - 20 mg/dL   Creatinine, Ser 7.820.41 (L) 0.44 - 1.00 mg/dL   Calcium 8.3 (L) 8.9 - 10.3 mg/dL   GFR calc non Af Amer >60 >60 mL/min   GFR calc Af Amer >60 >60 mL/min   Anion gap 6 5 - 15     Assessment/Plan:   NEURO  GCS 15; still somewhat agitated; following commands   Plan: Gets very agitated with medications withdrawal.  PULM  No known issues   Plan: Successfully extubated 1/19  CARDIO  No specific issues   Plan: CPM  RENAL  Adequate UOP; responded to IVF bolus 1/19   Plan:  GI  Splenic Trauma with Grade III laceration.    Plan: Hgb stable x48hrs; will start chemical dvt prophylaxis today  ID  No know infectious problems   Plan: CPM  HEME  Anemia acute blood loss anemia)   Plan: No need for blood transfusion; hgb stable  ENDO No known issues   Plan: CPM  Global Issues  PT/OT Neurosurgery - TLSO brace Ortho - TDWB c post hip precautions on left; platform/regular walker according to best tolerance L wrist Start chemical dvt ppx today   LOS: 2 days   Additional comments:I reviewed the patient's new clinical lab test results. cbc/bmet  Critical Care Total Time*: 30 Minutes  Andria MeuseChristopher M Shakora Nordquist 12/31/2017  *Care during the described time interval was provided by me and/or other providers on the critical care team.  I have reviewed this patient's available data,  including medical history, events of note, physical examination and test results as part of my evaluation.

## 2017-12-31 NOTE — Progress Notes (Addendum)
Orthopaedic Trauma Service (OTS)  2 Days Post-Op Procedure(s) (LRB): CLOSED REDUCTION HIP left, left arm laceration closure, left arm debridement, fluoro of left elbow and left hip (Left)  Subjective: Patient reports pain as mild.    Objective: Current Vitals Blood pressure 114/78, pulse 88, temperature 98.1 F (36.7 C), temperature source Oral, resp. rate 19, height 5' (1.524 m), weight 59 kg (130 lb), SpO2 100 %. Vital signs in last 24 hours: Temp:  [98.1 F (36.7 C)-99.4 F (37.4 C)] 98.1 F (36.7 C) (01/20 0417) Pulse Rate:  [88-129] 88 (01/20 0900) Resp:  [10-32] 19 (01/20 0900) BP: (106-131)/(60-96) 114/78 (01/20 0900) SpO2:  [97 %-100 %] 100 % (01/20 0900)  Intake/Output from previous day: 01/19 0701 - 01/20 0700 In: 2108.7 [P.O.:240; I.V.:1868.7] Out: 3800 [Urine:3800]  LABS Recent Labs    12/29/17 1838 12/30/17 0348 12/30/17 1656 12/30/17 2259 12/31/17 0430  HGB 9.1* 9.3*  9.2* 8.5* 9.1* 9.0*   Recent Labs    12/30/17 0348 12/31/17 0430  WBC 5.6 3.6*  RBC 3.51* 3.34*  HCT 29.4* 28.0*  PLT 184 157   Recent Labs    12/30/17 0348 12/31/17 0430  NA 139 141  K 3.7 4.0  CL 106 106  CO2 23 29  BUN <5* <5*  CREATININE 0.65 0.41*  GLUCOSE 111* 136*  CALCIUM 8.0* 8.3*   Recent Labs    12/29/17 0150  INR 1.14     Physical Exam LUE  Dressing clean and dry; changed last night  No NV changes LLE      Edema/ swelling controlled             Sens: DPN, SPN, TN intact             Motor: EHL, FHL, and lessor toe ext and flex all intact             DP 2+, Brisk cap refill, warm to touch      Knee immobilizer in place  Assessment/Plan: 2 Days Post-Op Procedure(s) (LRB): CLOSED REDUCTION HIP left, left arm laceration closure, left arm debridement, fluoro of left elbow and left hip (Left) 1. PT/OT TDWB with posterior hip precautions on the left; platform or regular walker according to best tolerance L wrist 2. DVT proph per Trauma Service 3.  Dressing changes daily left arm  Myrene GalasMichael Medha Pippen, MD [facility] [phone] (p)

## 2018-01-01 ENCOUNTER — Encounter (HOSPITAL_COMMUNITY): Payer: Self-pay | Admitting: Physical Medicine and Rehabilitation

## 2018-01-01 DIAGNOSIS — R7401 Elevation of levels of liver transaminase levels: Secondary | ICD-10-CM

## 2018-01-01 DIAGNOSIS — S5002XD Contusion of left elbow, subsequent encounter: Secondary | ICD-10-CM

## 2018-01-01 DIAGNOSIS — D62 Acute posthemorrhagic anemia: Secondary | ICD-10-CM

## 2018-01-01 DIAGNOSIS — F1910 Other psychoactive substance abuse, uncomplicated: Secondary | ICD-10-CM

## 2018-01-01 DIAGNOSIS — Z3490 Encounter for supervision of normal pregnancy, unspecified, unspecified trimester: Secondary | ICD-10-CM

## 2018-01-01 DIAGNOSIS — S5002XA Contusion of left elbow, initial encounter: Secondary | ICD-10-CM

## 2018-01-01 DIAGNOSIS — S73005A Unspecified dislocation of left hip, initial encounter: Secondary | ICD-10-CM

## 2018-01-01 DIAGNOSIS — S73005D Unspecified dislocation of left hip, subsequent encounter: Secondary | ICD-10-CM

## 2018-01-01 DIAGNOSIS — S36039D Unspecified laceration of spleen, subsequent encounter: Secondary | ICD-10-CM

## 2018-01-01 DIAGNOSIS — R52 Pain, unspecified: Secondary | ICD-10-CM

## 2018-01-01 DIAGNOSIS — G8918 Other acute postprocedural pain: Secondary | ICD-10-CM

## 2018-01-01 DIAGNOSIS — R740 Nonspecific elevation of levels of transaminase and lactic acid dehydrogenase [LDH]: Secondary | ICD-10-CM

## 2018-01-01 LAB — CBC
HCT: 33.1 % — ABNORMAL LOW (ref 36.0–46.0)
HEMOGLOBIN: 10.6 g/dL — AB (ref 12.0–15.0)
MCH: 26.2 pg (ref 26.0–34.0)
MCHC: 32 g/dL (ref 30.0–36.0)
MCV: 81.9 fL (ref 78.0–100.0)
Platelets: 237 10*3/uL (ref 150–400)
RBC: 4.04 MIL/uL (ref 3.87–5.11)
RDW: 13.4 % (ref 11.5–15.5)
WBC: 5.3 10*3/uL (ref 4.0–10.5)

## 2018-01-01 LAB — HIV ANTIBODY (ROUTINE TESTING W REFLEX): HIV Screen 4th Generation wRfx: NONREACTIVE

## 2018-01-01 MED ORDER — HYDROMORPHONE HCL 1 MG/ML IJ SOLN
1.0000 mg | INTRAMUSCULAR | Status: DC | PRN
Start: 2018-01-01 — End: 2018-01-02
  Administered 2018-01-01 – 2018-01-02 (×6): 1 mg via INTRAVENOUS
  Filled 2018-01-01 (×6): qty 1
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
## 2018-01-01 NOTE — Progress Notes (Signed)
CSW consulted for substance use. CSW spoke with pt at bedside and was informed by pt that pt uses drugs (heroin and opioids) regularly. Pt reports that pt uses daily and has used daily for the past 2 years. 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 Pt reports that pt has never had substance use treatment, however is open to any resources. Per pt, pt does not plan to limit drug use as pt expressed that there are home stressors that are forcing pt use. CSW provided an open space for pt to voice home life issues, however pt declined to go further in detail at this time. CSW to provide pt with substance use resources and resources to get clean needles if ever needed. At this time CSW remains available for support for pt and family.     Claude MangesKierra S. Fynley Chrystal, MSW, LCSW-A Emergency Department Clinical Social Worker [phone]
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
## 2018-01-01 NOTE — Anesthesia Postprocedure Evaluation (Signed)
Anesthesia Post Note  Patient: [name]  Procedure(s) Performed: CLOSED REDUCTION HIP left, left arm laceration closure, left arm debridement, fluoro of left elbow and left hip (Left Hip)     Patient location during evaluation: SICU Anesthesia Type: General Level of consciousness: sedated Pain management: pain level controlled Vital Signs Assessment: post-procedure vital signs reviewed and stable Respiratory status: patient remains intubated per anesthesia plan Cardiovascular status: stable Postop Assessment: no apparent nausea or vomiting Anesthetic complications: no    Last Vitals:  Vitals:   01/01/18 0700 01/01/18 0727  BP: 114/89   Pulse: 98   Resp: 15   Temp:  36.9 C  SpO2: 100%                    Beryle Lathehomas E Brock
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
## 2018-01-01 NOTE — Progress Notes (Signed)
Inpatient Rehabilitation  Per OT request, patient was screened by Alaiza Yau for appropriateness for an Inpatient Acute Rehab consult.  At this time we are recommending an Inpatient Rehab consult.  Text paged medical team to notify; please order if you are agreeable.    Arbadella Kimbler, M.A., CCC/SLP Admission Coordinator  [facility]  Cell [phone]  
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
## 2018-01-01 NOTE — Progress Notes (Signed)
I met with pt at bedside with her best friend. Pt states she does not want inpt rehab services. She wants to d/c home directly with her Mom and friends who she states can provide her 24/7 care. [phone]
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
## 2018-01-02 ENCOUNTER — Other Ambulatory Visit: Payer: Self-pay | Admitting: General Surgery

## 2018-01-02 ENCOUNTER — Inpatient Hospital Stay (HOSPITAL_COMMUNITY): Payer: No Typology Code available for payment source

## 2018-01-02 LAB — CBC
HCT: 33.3 % — ABNORMAL LOW (ref 36.0–46.0)
Hemoglobin: 10.6 g/dL — ABNORMAL LOW (ref 12.0–15.0)
MCH: 26.2 pg (ref 26.0–34.0)
MCHC: 31.8 g/dL (ref 30.0–36.0)
MCV: 82.4 fL (ref 78.0–100.0)
PLATELETS: 254 10*3/uL (ref 150–400)
RBC: 4.04 MIL/uL (ref 3.87–5.11)
RDW: 13.8 % (ref 11.5–15.5)
WBC: 4.9 10*3/uL (ref 4.0–10.5)

## 2018-01-02 MED ORDER — ENSURE ENLIVE PO LIQD: 237.0000 mL | Freq: Two times a day (BID) | ORAL | Status: DC

## 2018-01-02 MED ORDER — ACETAMINOPHEN 325 MG PO TABS
650.0000 mg | ORAL_TABLET | Freq: Four times a day (QID) | ORAL | Status: DC
Start: 2018-01-02 — End: 2018-01-02
  Administered 2018-01-02 (×2): 650 mg via ORAL
  Filled 2018-01-02 (×2): qty 2

## 2018-01-02 MED ORDER — HYDROMORPHONE HCL 1 MG/ML IJ SOLN
0.5000 mg | INTRAMUSCULAR | Status: DC | PRN
  Administered 2018-01-02: 0.5 mg via INTRAVENOUS
  Filled 2018-01-02: qty 1

## 2018-01-02 MED ORDER — TRAMADOL HCL 50 MG PO TABS: 100.0000 mg | ORAL_TABLET | Freq: Four times a day (QID) | ORAL | 1 refills | Status: DC

## 2018-01-02 MED ORDER — TRAMADOL HCL 50 MG PO TABS
50.0000 mg | ORAL_TABLET | Freq: Four times a day (QID) | ORAL | Status: DC
  Administered 2018-01-02 (×3): 50 mg via ORAL
  Filled 2018-01-02 (×3): qty 1

## 2018-01-02 MED ORDER — ADULT MULTIVITAMIN W/MINERALS CH: 1.0000 | ORAL_TABLET | Freq: Every day | ORAL | Status: DC

## 2018-01-02 MED ORDER — TRAMADOL HCL 50 MG PO TABS: 100.0000 mg | ORAL_TABLET | Freq: Four times a day (QID) | ORAL | 1 refills | Status: AC

## 2018-01-02 MED ORDER — FLUCONAZOLE 200 MG PO TABS: 200.0000 mg | ORAL_TABLET | Freq: Every day | ORAL | 0 refills | Status: AC

## 2018-01-02 MED ORDER — PENICILLIN V POTASSIUM 500 MG PO TABS: 500.0000 mg | ORAL_TABLET | Freq: Two times a day (BID) | ORAL | 0 refills | Status: DC
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
## 2018-01-02 NOTE — Progress Notes (Signed)
[name] to be D/C'd  per MD order. Discussed with the patient and all questions fully answered.  VSS, Skin clean, dry and intact without evidence of skin break down, no evidence of skin tears noted.  IV catheter discontinued intact. Site without signs and symptoms of complications. Dressing and pressure applied.  An After Visit Summary was printed and given to the patient. Patient received prescription.  D/c education completed with patient/family including follow up instructions, medication list, d/c activities limitations if indicated, with other d/c instructions as indicated by MD - patient able to verbalize understanding, all questions fully answered.   Patient instructed to return to ED, call 911, or call MD for any changes in condition.   Patient to be escorted via WC, and D/C home via private auto.
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
## 2018-01-02 NOTE — Progress Notes (Addendum)
Nurse called and stated pt had white spots on uvula. I went to speak to pt and she states she has had a sore throat since surgery. She noticed the white spots just today. Throat pain unchanged since surgery and no fevers. Pt states she had her tonsils removed in the past. Exam revealed a erythematous, slightly edematous uvula with white exudate no tonsils appear to be present and oropharynx not swollen. This is likely oral candidiasis. Pt states she has no know allergies to any medications. I instructed the pt to take the antifungal as prescribed and follow up with her PCP if not improved in 2-3 days. Pt expressed understanding.   Mattie MarlinJessica Stanlee Roehrig, [facility] Pager [phone]
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
## 2018-01-02 NOTE — Progress Notes (Signed)
Patient suffers from a left hip dislocation, right scapular fracture, and 5th metacarpal fracture which impairs their ability to perform daily activities like bathing, dressing and feeding in the home.  A walker will not resolve  issue with performing activities of daily living. A wheelchair will allow patient to safely perform daily activities. Patient can safely propel the wheelchair in the home or has a caregiver who can provide assistance.  Accessories: elevating leg rests (ELRs), wheel locks, extensions and anti-tippers.  Mattie MarlinJessica Rosabel Sermeno, [facility] Pager [phone]
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
## 2018-01-02 NOTE — Discharge Instructions (Signed)
STRICT POSTERIOR HIP PRECAUTIONS FOR 4 MONTHS and TOUCHDOWN WEIGHT BEARING ON YOUR LEFT LEG  Posterior Hip Precautions  Dont bend your hip past a 90 degree angle.  Dont cross your legs.  Dont twist your hip inwards- keep knees and toes pointed upwards. Following Restrictions To care for your new hip and keep it from sliding out of position, youll need to follow a few general rules at first. Your surgeon may recommend some additional restrictions based on your condition and type of surgery.       1. PAIN CONTROL:  1. Pain is best controlled by a usual combination of three different methods TOGETHER:  1. Ice/Heat 2. Over the counter pain medication 3. Prescription pain medication 2. Most patients will experience some swelling and bruising around wounds. Ice packs or heating pads (30-60 minutes up to 6 times a day) will help. Use ice for the first few days to help decrease swelling and bruising, then switch to heat to help relax tight/sore spots and speed recovery. Some people prefer to use ice alone, heat alone, alternating between ice & heat. Experiment to what works for you. Swelling and bruising can take several weeks to resolve.  3. It is helpful to take an over-the-counter pain medication regularly for the first few weeks. Choose one of the following that works best for you:  1. Naproxen (Aleve, etc) Two 220mg  tabs twice a day 2. Ibuprofen (Advil, etc) Three 200mg  tabs four times a day (every meal & bedtime) 3. Acetaminophen (Tylenol, etc) 500-650mg  four times a day (every meal & bedtime) 4. A prescription for pain medication (such as oxycodone, hydrocodone, etc) should be given to you upon discharge. Take your pain medication as prescribed.  1. If you are having problems/concerns with the prescription medicine (does not control pain, nausea, vomiting, rash, itching, etc), please call us [phone] to see if we need to switch you to a different pain medicine that will work  better for you and/or control your side effect better. 2. If you need a refill on your pain medication, please contact your pharmacy. They will contact our office to request authorization. Prescriptions will not be filled after 5 pm or on week-ends. 4. Avoid getting constipated. When taking pain medications, it is common to experience some constipation. Increasing fluid intake and taking a fiber supplement (such as Metamucil, Citrucel, FiberCon, MiraLax, etc) 1-2 times a day regularly will usually help prevent this problem from occurring. A mild laxative (prune juice, Milk of Magnesia, MiraLax, etc) should be taken according to package directions if there are no bowel movements after 48 hours.  5. Watch out for diarrhea. If you have many loose bowel movements, simplify your diet to bland foods & liquids for a few days. Stop any stool softeners and decrease your fiber supplement. Switching to mild anti-diarrheal medications (Kayopectate, Pepto Bismol) can help. If this worsens or does not improve, please call us. 6. FOLLOW UP in our office  Please call [facility] at [phone] to set up an appointment for a follow-up appointment approximately 2-3 weeks after discharge   WHEN TO CALL us [phone]:  1. Poor pain control 2. Reactions / problems with new medications (rash/itching, nausea, etc)  3. Fever over 101.5 F (38.5 C) 4. Worsening swelling or bruising 5. Continued bleeding from wounds. 6. Increased pain, redness, or drainage from the wounds which could be signs of infection  The clinic staff is available to answer your questions during regular business hours (8:30am-5pm). Please  dont hesitate to call and ask to speak to one of our nurses for clinical concerns.  If you have a medical emergency, go to the nearest emergency room or call 911.  A surgeon from [facility] is always on call at the [facility]   [facility] [place]  [address], [address],  [place], [place] [address] ?  MAIN: [phone] ? TOLL FREE: [phone] ?  FAX [phone]  [email or web address]   TAKE 325MG  OF ASPIRIN DAILY UNTIL YOU FOLLOW UP WITH ORTHOPEDICS
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
## 2019-06-26 IMAGING — CR DG TIBIA/FIBULA 2V*L*
2 series · 2 of 2 positions shown · non-contrast
Comparison: None.

CLINICAL DATA: 21-year-old female with trauma and left lower
extremity pain.

EXAM:
LEFT TIBIA AND FIBULA - 2 VIEW; LEFT KNEE - COMPLETE 4+ VIEW; LEFT
FOOT - COMPLETE 3+ VIEW

[tibia ap]
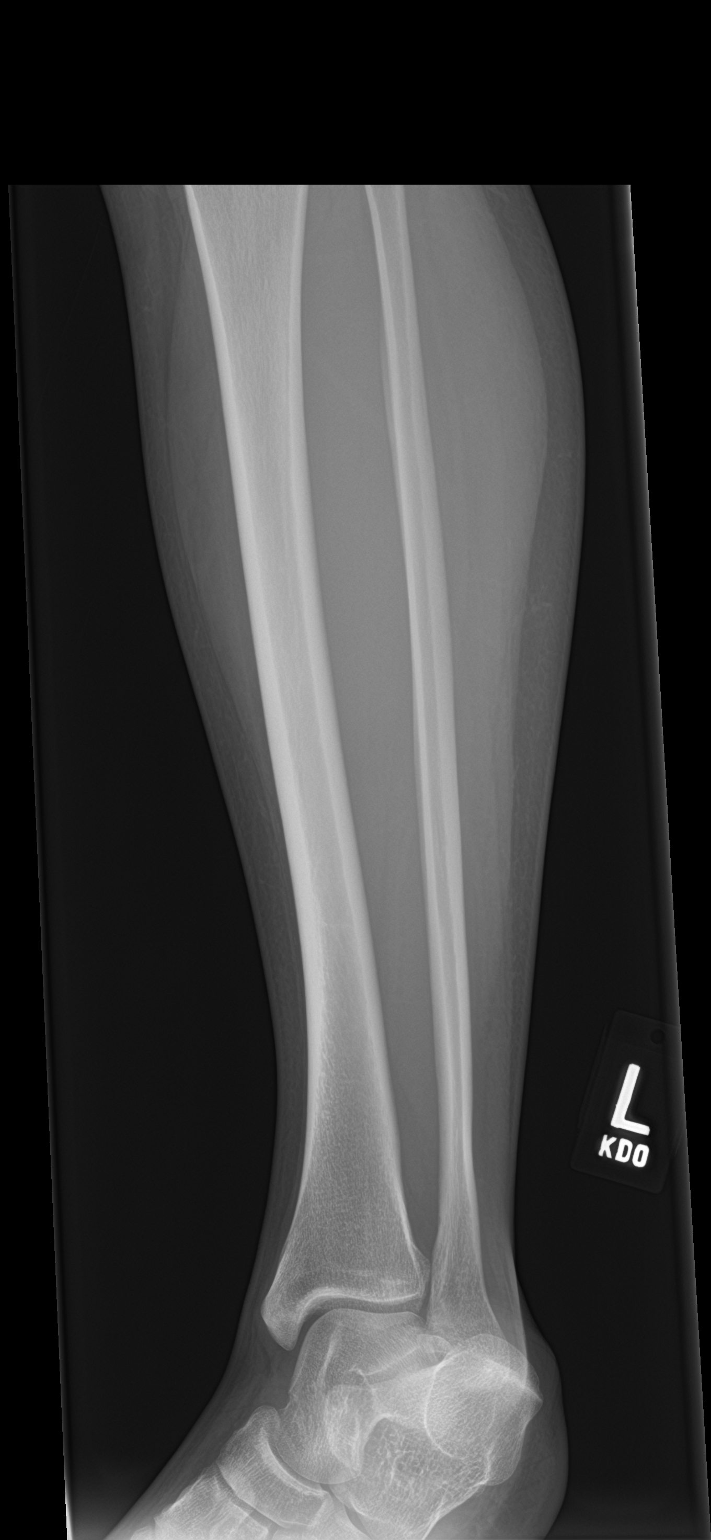

[tibia lat]
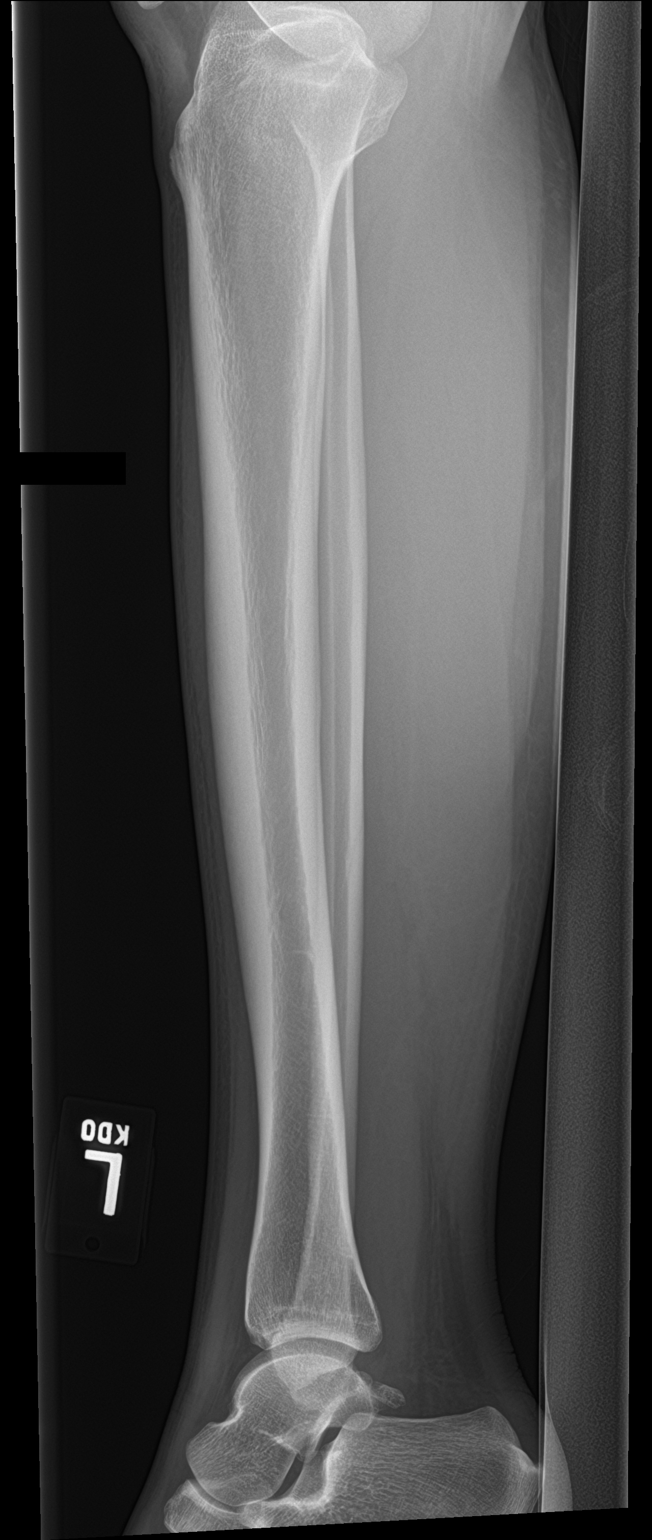

[2 of 2 positions shown; findings below may reference images not displayed]

FINDINGS: There is no evidence of fracture or other focal bone lesions. Soft
tissues are unremarkable.
IMPRESSION: Negative.
